# Patient Record
Sex: Female | Born: 1980 | Race: White | Hispanic: No | Marital: Married | Smoking: Never smoker
Health system: Southern US, Community
[De-identification: ages and names within clinical notes are randomized; demographics above are authoritative.]

## PROBLEM LIST (undated history)

## (undated) DIAGNOSIS — K5792 Diverticulitis of intestine, part unspecified, without perforation or abscess without bleeding: Secondary | ICD-10-CM

---

## 2010-02-04 ENCOUNTER — Emergency Department: Payer: Self-pay | Admitting: Unknown Physician Specialty

## 2016-06-30 ENCOUNTER — Other Ambulatory Visit: Payer: Self-pay | Admitting: Family Medicine

## 2016-06-30 ENCOUNTER — Ambulatory Visit
Admission: RE | Admit: 2016-06-30 | Discharge: 2016-06-30 | Disposition: A | Discharge status: Home / Self Care | Source: Ambulatory Visit | Attending: Family Medicine | Admitting: Family Medicine

## 2016-06-30 DIAGNOSIS — R140 Abdominal distension (gaseous): Secondary | ICD-10-CM

## 2016-07-02 ENCOUNTER — Other Ambulatory Visit: Payer: Self-pay | Admitting: Family Medicine

## 2016-07-02 ENCOUNTER — Ambulatory Visit
Admission: RE | Admit: 2016-07-02 | Discharge: 2016-07-02 | Disposition: A | Discharge status: Home / Self Care | Source: Ambulatory Visit | Attending: Family Medicine | Admitting: Family Medicine

## 2016-07-02 ENCOUNTER — Ambulatory Visit: Admission: RE | Admit: 2016-07-02 | Source: Ambulatory Visit | Admitting: Family Medicine

## 2016-07-02 DIAGNOSIS — R1032 Left lower quadrant pain: Secondary | ICD-10-CM | POA: Diagnosis not present

## 2016-07-02 DIAGNOSIS — R935 Abnormal findings on diagnostic imaging of other abdominal regions, including retroperitoneum: Secondary | ICD-10-CM | POA: Insufficient documentation

## 2016-07-02 MED ORDER — IOPAMIDOL (ISOVUE-300) INJECTION 61%
100.0000 mL | Freq: Once | INTRAVENOUS | Status: AC | PRN
  Administered 2016-07-02: 100 mL via INTRAVENOUS

## 2016-07-03 ENCOUNTER — Ambulatory Visit

## 2016-09-05 ENCOUNTER — Encounter: Payer: Self-pay | Admitting: Emergency Medicine

## 2016-09-05 ENCOUNTER — Other Ambulatory Visit: Payer: Self-pay | Admitting: Family Medicine

## 2016-09-05 ENCOUNTER — Emergency Department
Admission: EM | Admit: 2016-09-05 | Discharge: 2016-09-05 | Disposition: A | Discharge status: Home / Self Care | Attending: Emergency Medicine | Admitting: Emergency Medicine

## 2016-09-05 ENCOUNTER — Emergency Department

## 2016-09-05 DIAGNOSIS — R1032 Left lower quadrant pain: Secondary | ICD-10-CM | POA: Diagnosis not present

## 2016-09-05 DIAGNOSIS — R109 Unspecified abdominal pain: Secondary | ICD-10-CM

## 2016-09-05 HISTORY — DX: Diverticulitis of intestine, part unspecified, without perforation or abscess without bleeding: K57.92

## 2016-09-05 LAB — URINALYSIS, COMPLETE (UACMP) WITH MICROSCOPIC
BACTERIA UA: NONE SEEN
Bilirubin Urine: NEGATIVE
Glucose, UA: NEGATIVE mg/dL
Hgb urine dipstick: NEGATIVE
Ketones, ur: NEGATIVE mg/dL
Leukocytes, UA: NEGATIVE
Nitrite: NEGATIVE
PH: 7 (ref 5.0–8.0)
Protein, ur: NEGATIVE mg/dL
RBC / HPF: NONE SEEN RBC/hpf (ref 0–5)
SPECIFIC GRAVITY, URINE: 1.005 (ref 1.005–1.030)

## 2016-09-05 LAB — CBC
HEMATOCRIT: 42.9 % (ref 35.0–47.0)
HEMOGLOBIN: 14.5 g/dL (ref 12.0–16.0)
MCH: 29.8 pg (ref 26.0–34.0)
MCHC: 33.8 g/dL (ref 32.0–36.0)
MCV: 88.3 fL (ref 80.0–100.0)
Platelets: 324 10*3/uL (ref 150–440)
RBC: 4.86 MIL/uL (ref 3.80–5.20)
RDW: 13.3 % (ref 11.5–14.5)
WBC: 10 10*3/uL (ref 3.6–11.0)

## 2016-09-05 LAB — COMPREHENSIVE METABOLIC PANEL
ALBUMIN: 4.7 g/dL (ref 3.5–5.0)
ALK PHOS: 67 U/L (ref 38–126)
ALT: 41 U/L (ref 14–54)
ANION GAP: 10 (ref 5–15)
AST: 39 U/L (ref 15–41)
BILIRUBIN TOTAL: 1.2 mg/dL (ref 0.3–1.2)
BUN: 10 mg/dL (ref 6–20)
CO2: 25 mmol/L (ref 22–32)
Calcium: 9.7 mg/dL (ref 8.9–10.3)
Chloride: 102 mmol/L (ref 101–111)
Creatinine, Ser: 0.74 mg/dL (ref 0.44–1.00)
GFR calc Af Amer: >60 mL/min (ref >60)
GFR calc non Af Amer: >60 mL/min (ref >60)
GLUCOSE: 115 mg/dL — AB (ref 65–99)
Potassium: 3.6 mmol/L (ref 3.5–5.1)
SODIUM: 137 mmol/L (ref 135–145)
Total Protein: 7.8 g/dL (ref 6.5–8.1)

## 2016-09-05 LAB — POCT PREGNANCY, URINE: PREG TEST UR: NEGATIVE

## 2016-09-05 LAB — LIPASE, BLOOD: Lipase: 26 U/L (ref 11–51)

## 2016-09-05 LAB — TROPONIN I

## 2016-09-05 MED ORDER — IOPAMIDOL (ISOVUE-300) INJECTION 61%
100.0000 mL | Freq: Once | INTRAVENOUS | Status: AC | PRN
  Administered 2016-09-05: 100 mL via INTRAVENOUS
  Filled 2016-09-05: qty 100

## 2016-09-05 MED ORDER — IOPAMIDOL (ISOVUE-300) INJECTION 61%
30.0000 mL | Freq: Once | INTRAVENOUS | Status: AC | PRN
  Administered 2016-09-05: 30 mL via ORAL

## 2016-09-05 NOTE — ED Provider Notes (Addendum)
[facility] Emergency Department Provider Note       Time seen: ----------------------------------------- 9:17 PM on 09/05/2016 -----------------------------------------     I have reviewed the triage vital signs and the nursing notes.   HISTORY   Chief Complaint Abdominal Pain    HPI Emily Michael is a 36 y.o. female who presents to the ED for left lower quadrant abdominal pain. Patient states she called her primary care doctor today who told to come to the ER and arrange for CT scan. Patient said history diverticulitis and pain feels similar to that. Pain is chronic for her to 10 that she describes as cramping, nothing makes it better or worse. She has not had other associated symptoms.   Past Medical History:  Diagnosis Date  . Diverticulitis     There are no active problems to display for this patient.   History reviewed. No pertinent surgical history.  Allergies Patient has no known allergies.  Social History Social History  Substance Use Topics  . Smoking status: Never Smoker  . Smokeless tobacco: Never Used  . Alcohol use No    Review of Systems Constitutional: Negative for fever. Eyes: Negative for vision changes ENT:  Negative for congestion, sore throat Cardiovascular: Negative for chest pain. Respiratory: Negative for shortness of breath. Gastrointestinal: Positive for abdominal pain Genitourinary: Negative for dysuria. Musculoskeletal: Negative for back pain. Skin: Negative for rash. Neurological: Negative for headaches, focal weakness or numbness.  All systems negative/normal/unremarkable except as stated in the HPI  ____________________________________________   PHYSICAL EXAM:  VITAL SIGNS: ED Triage Vitals  Enc Vitals Group     BP 09/05/16 1919 (!) 161/105     Pulse Rate 09/05/16 1919 (!) 105     Resp 09/05/16 1919 18     Temp 09/05/16 1919 99.6 F (37.6 C)     Temp Source 09/05/16 1919 Oral     SpO2  09/05/16 1919 99 %     Weight 09/05/16 1920 240 lb (108.9 kg)     Height 09/05/16 1920  (1.676 m)     Head Circumference --      Peak Flow --      Pain Score 09/05/16 1919 4     Pain Loc --      Pain Edu? --      Excl. in GC? --     Constitutional: Alert and oriented. Well appearing and in no distress. Eyes: Conjunctivae are normal. PERRL. Normal extraocular movements. ENT   Head: Normocephalic and atraumatic.   Nose: No congestion/rhinnorhea.   Mouth/Throat: Mucous membranes are moist.   Neck: No stridor. Cardiovascular: Normal rate, regular rhythm. No murmurs, rubs, or gallops. Respiratory: Normal respiratory effort without tachypnea nor retractions. Breath sounds are clear and equal bilaterally. No wheezes/rales/rhonchi. Gastrointestinal: Mild left lower quadrant tenderness, rebound or guarding. Normal bowel sounds. Musculoskeletal: Nontender with normal range of motion in extremities. No lower extremity tenderness nor edema. Neurologic:  Normal speech and language. No gross focal neurologic deficits are appreciated.  Skin:  Skin is warm, dry and intact. No rash noted.  ____________________________________________  ED COURSE:  Pertinent labs & imaging results that were available during my care of the patient were reviewed by me and considered in my medical decision making (see chart for details). Patient presents for abdominal pain, we will assess with labs and imaging as indicated.   Procedures ____________________________________________   LABS (pertinent positives/negatives)  Labs Reviewed  COMPREHENSIVE METABOLIC PANEL - Abnormal; Notable for the following:  Result Value   Glucose, Bld 115 (*)    All other components within normal limits  URINALYSIS, COMPLETE (UACMP) WITH MICROSCOPIC - Abnormal; Notable for the following:    Color, Urine STRAW (*)    APPearance CLEAR (*)    Squamous Epithelial / LPF 0-5 (*)    All other components within  normal limits  LIPASE, BLOOD  CBC  TROPONIN I  POC URINE PREG, ED  POCT PREGNANCY, URINE    RADIOLOGY Images were viewed by me  CT the abdomen and pelvis with contrast  IMPRESSION: Chronic diverticulosis. No evidence of acute diverticulitis today. No specific cause of left lower quadrant abdominal pain identified.  Chronic fatty liver. ____________________________________________  FINAL ASSESSMENT AND PLAN  Flank pain  Plan: Patient's labs and imaging were dictated above. Patient had presented for flank pain of uncertain etiology. Negative CT scan with contrast. She has a benign exam and is stable for outpatient follow-up.   Emily Filbert, MD   Note: This note was generated in part or whole with voice recognition software. Voice recognition is usually quite accurate but there are transcription errors that can and very often do occur. I apologize for any typographical errors that were not detected and corrected.     Emily Filbert, MD 09/05/16 2118    Emily Filbert, MD 09/05/16 2138

## 2016-09-05 NOTE — ED Triage Notes (Signed)
Pt arrives POV ambulatory to triage with c/o lower left abdominal pain. Pt states that she called her primary care Dr today who told her that she would pt a CT order in but did not sign. Pt has hx diverticulitis and family hx of the same. Pt is in NAD at this time.

## 2017-02-15 ENCOUNTER — Emergency Department
Admission: EM | Admit: 2017-02-15 | Discharge: 2017-02-16 | Disposition: A | Discharge status: Home / Self Care | Attending: Emergency Medicine | Admitting: Emergency Medicine

## 2017-02-15 ENCOUNTER — Encounter: Payer: Self-pay | Admitting: Emergency Medicine

## 2017-02-15 DIAGNOSIS — R509 Fever, unspecified: Secondary | ICD-10-CM | POA: Insufficient documentation

## 2017-02-15 DIAGNOSIS — R51 Headache: Secondary | ICD-10-CM | POA: Diagnosis not present

## 2017-02-15 DIAGNOSIS — R000 Tachycardia, unspecified: Secondary | ICD-10-CM | POA: Insufficient documentation

## 2017-02-15 DIAGNOSIS — R070 Pain in throat: Secondary | ICD-10-CM | POA: Insufficient documentation

## 2017-02-15 DIAGNOSIS — M791 Myalgia, unspecified site: Secondary | ICD-10-CM | POA: Diagnosis not present

## 2017-02-15 DIAGNOSIS — R519 Headache, unspecified: Secondary | ICD-10-CM

## 2017-02-15 LAB — BASIC METABOLIC PANEL
ANION GAP: 11 (ref 5–15)
BUN: 7 mg/dL (ref 6–20)
CHLORIDE: 102 mmol/L (ref 101–111)
CO2: 25 mmol/L (ref 22–32)
Calcium: 9.9 mg/dL (ref 8.9–10.3)
Creatinine, Ser: 0.71 mg/dL (ref 0.44–1.00)
GFR calc non Af Amer: >60 mL/min (ref >60)
GLUCOSE: 113 mg/dL — AB (ref 65–99)
Potassium: 3.6 mmol/L (ref 3.5–5.1)
Sodium: 138 mmol/L (ref 135–145)

## 2017-02-15 LAB — CBC WITH DIFFERENTIAL/PLATELET
Basophils Absolute: 0 10*3/uL (ref 0–0.1)
Basophils Relative: 0 %
EOS PCT: 0 %
Eosinophils Absolute: 0 10*3/uL (ref 0–0.7)
HCT: 40.4 % (ref 35.0–47.0)
HEMOGLOBIN: 13.9 g/dL (ref 12.0–16.0)
LYMPHS ABS: 0.8 10*3/uL — AB (ref 1.0–3.6)
LYMPHS PCT: 8 %
MCH: 30 pg (ref 26.0–34.0)
MCHC: 34.3 g/dL (ref 32.0–36.0)
MCV: 87.4 fL (ref 80.0–100.0)
MONOS PCT: 6 %
Monocytes Absolute: 0.6 10*3/uL (ref 0.2–0.9)
Neutro Abs: 9 10*3/uL — ABNORMAL HIGH (ref 1.4–6.5)
Neutrophils Relative %: 86 %
PLATELETS: 267 10*3/uL (ref 150–440)
RBC: 4.62 MIL/uL (ref 3.80–5.20)
RDW: 13.5 % (ref 11.5–14.5)
WBC: 10.5 10*3/uL (ref 3.6–11.0)

## 2017-02-15 LAB — TROPONIN I: Troponin I: <0.03 ng/mL (ref <0.03)

## 2017-02-15 LAB — LACTIC ACID, PLASMA
Lactic Acid, Venous: 2 mmol/L (ref 0.5–1.9)
Lactic Acid, Venous: 1.5 mmol/L (ref 0.5–1.9)

## 2017-02-15 LAB — POCT PREGNANCY, URINE: Preg Test, Ur: NEGATIVE

## 2017-02-15 MED ORDER — KETOROLAC TROMETHAMINE 30 MG/ML IJ SOLN
30.0000 mg | Freq: Once | INTRAMUSCULAR | Status: AC
  Administered 2017-02-15: 30 mg via INTRAVENOUS
  Filled 2017-02-15: qty 1

## 2017-02-15 MED ORDER — SODIUM CHLORIDE 0.9 % IV BOLUS (SEPSIS)
1000.0000 mL | Freq: Once | INTRAVENOUS | Status: AC
  Administered 2017-02-15: 1000 mL via INTRAVENOUS

## 2017-02-15 MED ORDER — PROCHLORPERAZINE EDISYLATE 5 MG/ML IJ SOLN
10.0000 mg | Freq: Once | INTRAMUSCULAR | Status: AC
  Administered 2017-02-15: 10 mg via INTRAVENOUS
  Filled 2017-02-15: qty 2

## 2017-02-15 NOTE — Discharge Instructions (Signed)
Please seek medical attention for any high fevers, chest pain, shortness of breath, change in behavior, persistent vomiting, bloody stool or any other new or concerning symptoms.  

## 2017-02-15 NOTE — ED Triage Notes (Signed)
Pt comes into the ED via POV c/o headache.  Patient states she has had a low grade fever of 99.5 but has been taking ibuprofen and tylenol with no success for pain control.  Patient is ambulatory to triage with no respiratory problems.  Denies any chest pain, dizziness, N/V.

## 2017-02-15 NOTE — ED Provider Notes (Addendum)
[facility] Emergency Department Provider Note   ____________________________________________   I have reviewed the triage vital signs and the nursing notes.   HISTORY  Chief Complaint Headache   History limited by: Not Limited   HPI Emily Michael is a 36 y.o. female who presents to the emergency department today because of concern for headache, fever, body aches and sore throat. Body aches are diffuse throughout her body. Symptoms started last night. Has been running fever of 99 since then. Has taken ibuprofen without significant relief. States she has an associated headache with pain to the back of her head. Feels like her throat is sore. No known sick contacts.     Per medical record patient without any recent ED visits.   Past Medical History:  Diagnosis Date  . Diverticulitis     There are no active problems to display for this patient.   History reviewed. No pertinent surgical history.  Prior to Admission medications   Not on File    Allergies Patient has no known allergies.  No family history on file.  Social History Social History  Substance Use Topics  . Smoking status: Never Smoker  . Smokeless tobacco: Never Used  . Alcohol use No    Review of Systems Constitutional: Positive for fever. Eyes: No visual changes. ENT: Positive for sore throat.  Cardiovascular: Denies chest pain. Respiratory: Denies shortness of breath. Gastrointestinal: No abdominal pain.  Positive for nausea.  Genitourinary: Negative for dysuria. Musculoskeletal: Positive for muscle aches.  Skin: Negative for rash. Neurological: Positive for headache.   ____________________________________________   PHYSICAL EXAM:  VITAL SIGNS: ED Triage Vitals  Enc Vitals Group     BP 02/15/17 1830 (!) 173/90     Pulse Rate 02/15/17 1830 (!) 120     Resp 02/15/17 1830 20     Temp 02/15/17 1830 99.9 F (37.7 C)     Temp Source 02/15/17 1830 Oral     SpO2  02/15/17 1830 100 %     Weight 02/15/17 1829 235 lb (106.6 kg)     Height 02/15/17 1829  (1.676 m)     Head Circumference --      Peak Flow --      Pain Score 02/15/17 1829 8   Constitutional: Alert and oriented. Well appearing and in no distress. Eyes: Conjunctivae are normal.  ENT   Head: Normocephalic and atraumatic.   Nose: No congestion/rhinnorhea.   Mouth/Throat: Mucous membranes are moist.   Neck: No stridor. Hematological/Lymphatic/Immunilogical: No cervical lymphadenopathy. Cardiovascular: Tachyicardia, regular rhythm.  No murmurs, rubs, or gallops. Respiratory: Normal respiratory effort without tachypnea nor retractions. Breath sounds are clear and equal bilaterally. No wheezes/rales/rhonchi. Gastrointestinal: Soft and non tender. No rebound. No guarding.  Genitourinary: Deferred Musculoskeletal: Normal range of motion in all extremities. No lower extremity edema. Neurologic:  Normal speech and language. No gross focal neurologic deficits are appreciated.  Skin:  Skin is warm, dry and intact. No rash noted. Psychiatric: Mood and affect are normal. Speech and behavior are normal. Patient exhibits appropriate insight and judgment.  ____________________________________________    LABS (pertinent positives/negatives)  Lactic 1.5<- 2.0 WBC 10.5 BMP wnl except for glu 113 Trop <0.03  ____________________________________________   EKG  I, Phineas Semen, attending physician, personally viewed and interpreted this EKG  EKG Time: 2357 Rate: 143 Rhythm: sinus tachycardia Axis: normal Intervals: qtc 437 QRS: narrow ST changes: no st elevation Impression: abnormal ekg   ____________________________________________    RADIOLOGY  None  ____________________________________________   PROCEDURES  Procedures  ____________________________________________   INITIAL IMPRESSION / ASSESSMENT AND PLAN / ED COURSE  Pertinent labs & imaging  results that were available during my care of the patient were reviewed by me and considered in my medical decision making (see chart for details).  Patient presented to the emergency department today because of concern for myalgias, fever, headache. ddx included viral syndrome, electrolyte abnormality, migraine amongst other etiologies. On exam patient without meningismus. Initial lactic was mildly elevated, however repeat normalized. Most concerning finding was tachycardia. This did persist during her stay in the emergency department. Patient however did clinically feel better after fluids and toradol. At this point I think viral illness most likely. Did discuss concern for sinus tachycardia. Patient stated that she has history of this, has seen cardiologist for it in the past. Additionally states she has had issues with anxiety and feels anxious here in the emergency department. We did discuss return precautions.   prior to discharge patient's heart rate jumped into the 140s and 150s. EKG did show sinus tachycardia. Will try some Ativan if this is secondary to anxiety.  when I went to discuss with patient the plan to try some Ativan reassessing the heart rate she stated she did not want the Ativan. She states she does have Ativan at home for anxiety. She states she does think that most of her past heart rate is secondary to anxiety. I did discuss with patient I did not feel completely comfortable with her going home at this point however do not feel that I have any cause to put her under IVC. Patient is certainly capable making this decision. I did discuss the patient risk of continued high heart rate. Patient states she has a blood pressure cuff and can check it at home. I did ask her to check her after she gets home and takes her Ativan. She states it is still elevated she will call the ambulance.  ____________________________________________   FINAL CLINICAL IMPRESSION(S) / ED DIAGNOSES  Final  diagnoses:  Sinus tachycardia  Bad headache  Myalgia     Note: This dictation was prepared with Dragon dictation. Any transcriptional errors that result from this process are unintentional     Phineas Semen, MD 02/16/17 0001    Phineas Semen, MD 02/16/17 [phone]

## 2017-02-16 LAB — URINALYSIS, COMPLETE (UACMP) WITH MICROSCOPIC
BACTERIA UA: NONE SEEN
BILIRUBIN URINE: NEGATIVE
Glucose, UA: NEGATIVE mg/dL
Hgb urine dipstick: NEGATIVE
Ketones, ur: NEGATIVE mg/dL
Leukocytes, UA: NEGATIVE
NITRITE: NEGATIVE
PH: 7 (ref 5.0–8.0)
Protein, ur: NEGATIVE mg/dL
SPECIFIC GRAVITY, URINE: 1.003 — AB (ref 1.005–1.030)

## 2017-02-16 MED ORDER — LORAZEPAM 2 MG/ML IJ SOLN
1.0000 mg | Freq: Once | INTRAMUSCULAR | Status: DC
  Filled 2017-02-16: qty 1

## 2017-02-16 MED ORDER — LORAZEPAM 2 MG/ML IJ SOLN: 1.0000 mg | Freq: Once | INTRAMUSCULAR | Status: DC

## 2017-02-16 NOTE — ED Notes (Signed)
Patient discharge and follow up information reviewed with patient by ED nursing staff and patient given the opportunity to ask questions pertaining to ED visit and discharge plan of care. Patient advised that should symptoms not continue to improve, resolve entirely, or should new symptoms develop then a follow up visit with their PCP or a return visit to the ED may be warranted. Patient verbalized consent and understanding of discharge plan of care including potential need for further evaluation. Patient being discharged in stable condition per attending ED physician on duty.   Pt is leaving Against Medical Advice; pt aware of elevated HR and BP; pt advised to call EMS if HR and BP do not return to normal range within half hour of reaching home and taking anxiety medication.

## 2017-02-17 LAB — URINE CULTURE: Culture: 80000 — AB

## 2017-08-05 IMAGING — CT CT ABD-PELV W/ CM
2 of 4 series · 16 of 46 positions shown, 18 images · IV contrast (APPLIED)
Comparison: None.

CLINICAL DATA: Left lower quadrant abdominal pain for 3 weeks

EXAM:
CT ABDOMEN AND PELVIS WITH CONTRAST
TECHNIQUE: Multidetector CT imaging of the abdomen and pelvis was performed
using the standard protocol following bolus administration of
intravenous contrast.
CONTRAST:  100mL [id number] IOPAMIDOL (FKVWKD-F77) INJECTION 61%

[Series 2: axial st · axial · 0.96mm/px · z∈[-588,-128]mm · 13 of 100 slices shown, 15 images]
[im 4/100  soft-tissue]
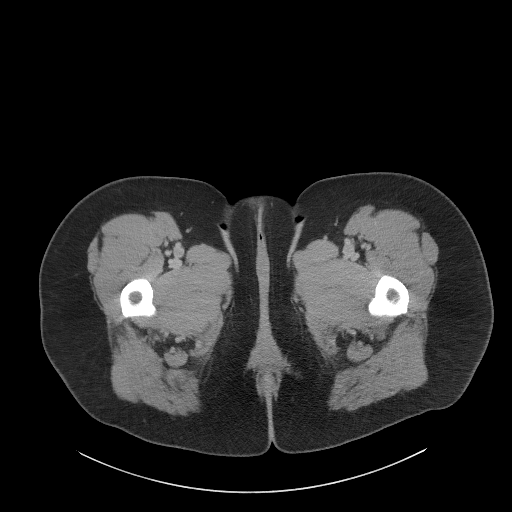
[im 4/100  bone]
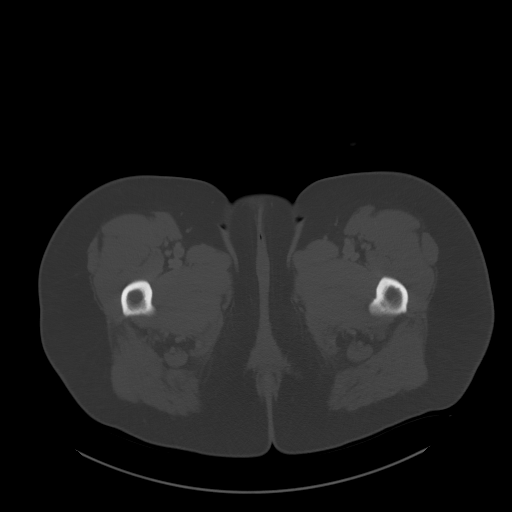
[im 12/100  soft-tissue]
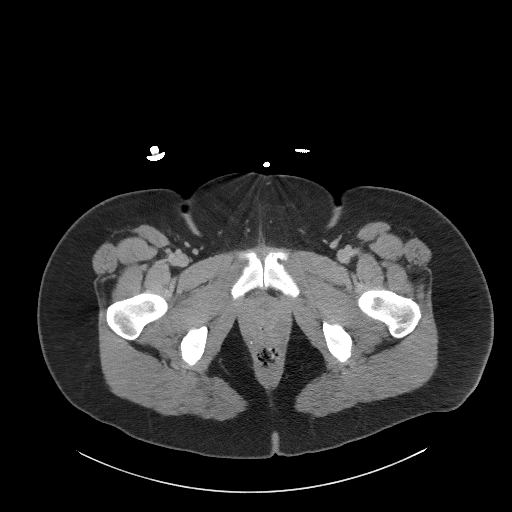
[im 20/100  soft-tissue]
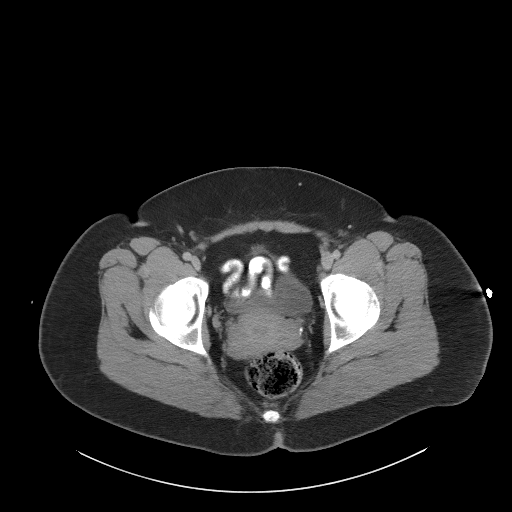
[im 28/100  soft-tissue]
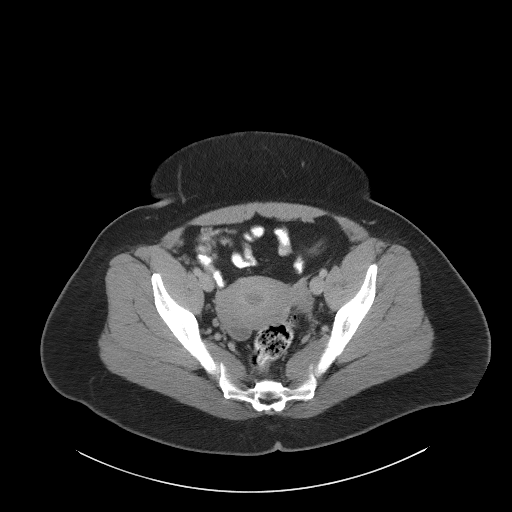
[im 36/100  soft-tissue]
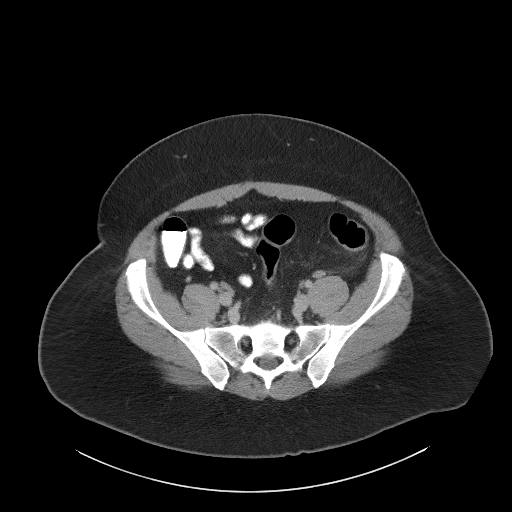
[im 44/100  soft-tissue]
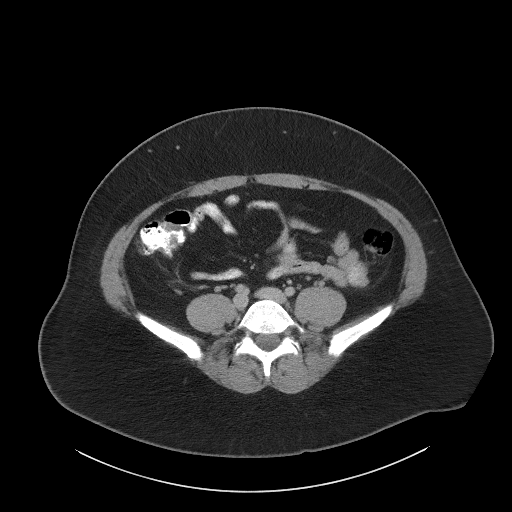
[im 52/100  soft-tissue]
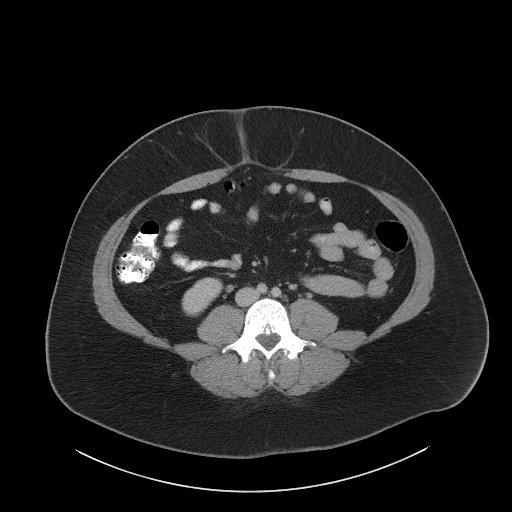
[im 56/100  soft-tissue]
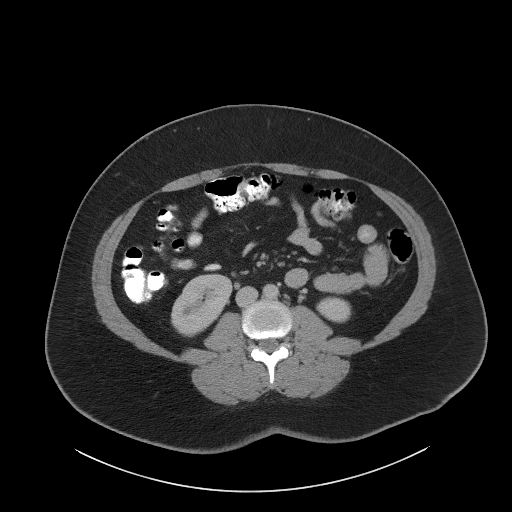
[im 64/100  soft-tissue]
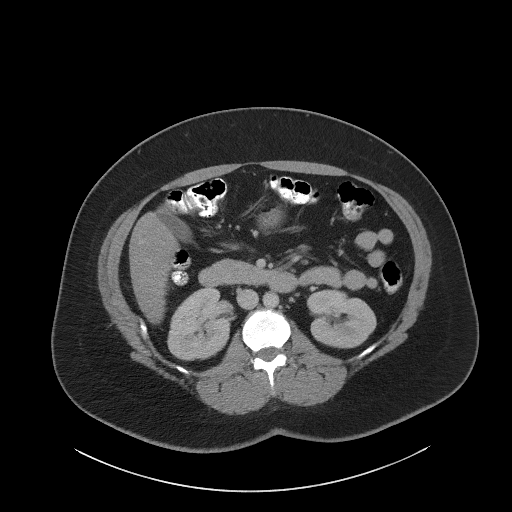
[im 64/100  bone]
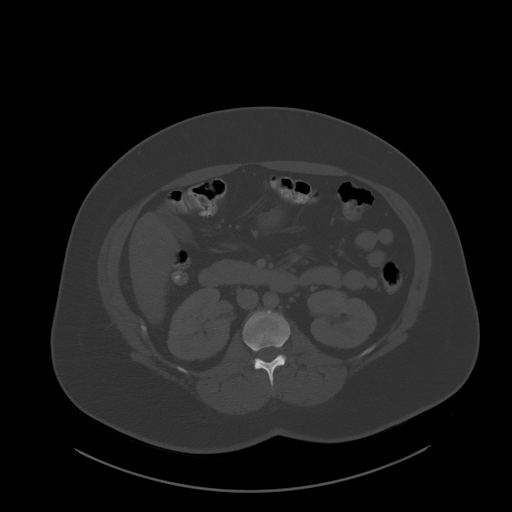
[im 72/100  soft-tissue]
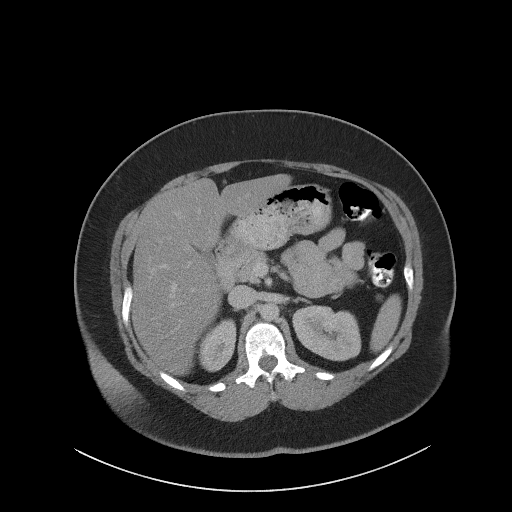
[im 80/100  soft-tissue]
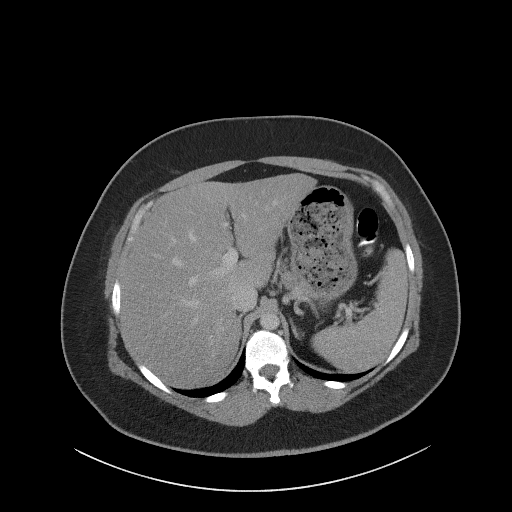
[im 88/100  soft-tissue]
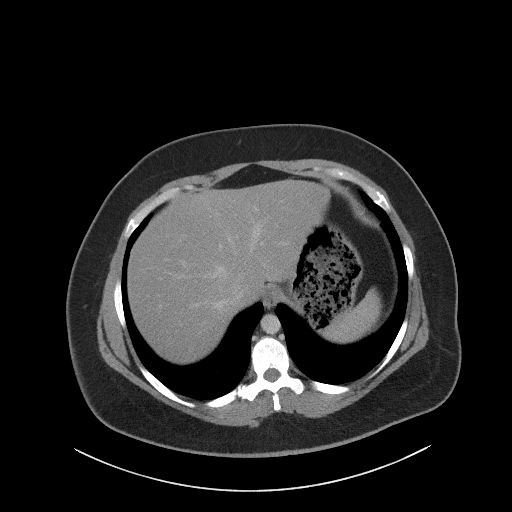
[im 96/100  soft-tissue]
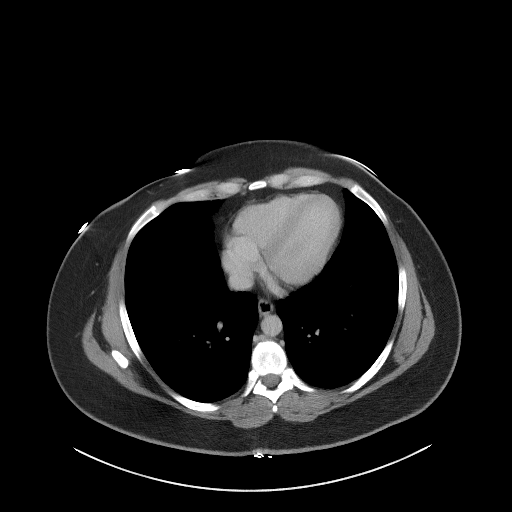

[Series 5: coronal st · coronal · 0.76mm/px · 3 of 106 slices shown]
[im 36/106  soft-tissue]
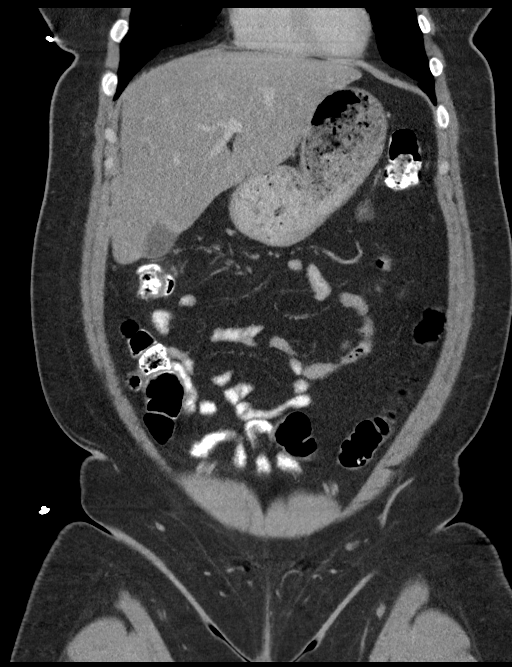
[im 47/106  soft-tissue]
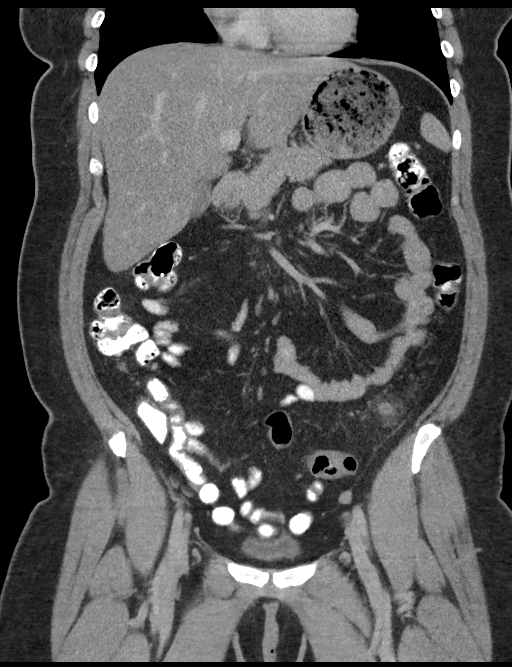
[im 59/106  soft-tissue]
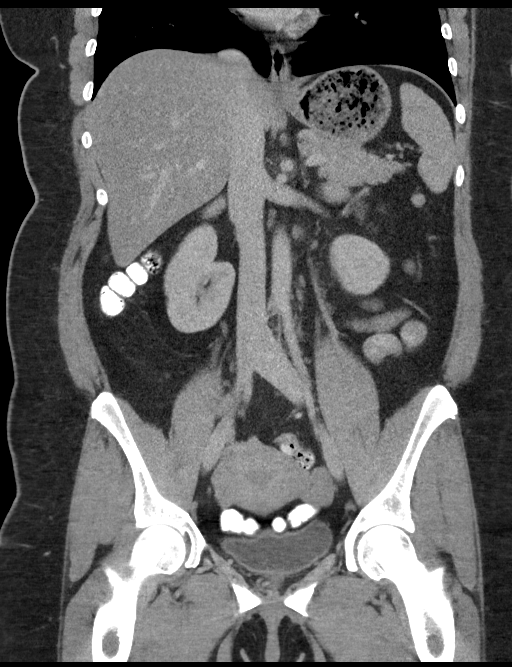

[16 of 46 positions shown; findings below may reference images not displayed]

FINDINGS: Lower chest: No acute abnormality.

Hepatobiliary: Hepatic steatosis. No calcified gallstones. No
biliary dilatation

Pancreas: Unremarkable. No pancreatic ductal dilatation or
surrounding inflammatory changes.

Spleen: Normal in size without focal abnormality.

Adrenals/Urinary Tract: Adrenal glands are unremarkable. Kidneys are
normal, without renal calculi, focal lesion, or hydronephrosis.
Bladder is unremarkable.

Stomach/Bowel: Normal appendix. Nonenlarged stomach. No dilated
small bowel.

There is mild soft tissue stranding in the left lower quadrant
around the distal descending/ proximal sigmoid colon with suggestion
of a diverticula in the region. No perforation or abscess.

Vascular/Lymphatic: No significant vascular findings are present. No
enlarged abdominal or pelvic lymph nodes.

Reproductive: Uterus and bilateral adnexa are unremarkable.

Other: No abdominal wall hernia or abnormality. No abdominopelvic
ascites.

Musculoskeletal: No acute or significant osseous findings.
IMPRESSION: 1. Mild inflammatory process in the left lower quadrant of the
abdomen adjacent to the distal descending/proximal sigmoid colon.
Suggestion of a diverticula in the region, findings suspected to be
secondary to mild diverticulitis. No perforation or abscess.
2. Otherwise negative CT examination of the abdomen and pelvis
